# Patient Record
Sex: Female | Born: 1960 | Race: White | Hispanic: No | Marital: Married
Health system: Southern US, Community
[De-identification: ages and names within clinical notes are randomized; demographics above are authoritative.]

---

## 1998-07-24 ENCOUNTER — Other Ambulatory Visit: Admission: RE | Admit: 1998-07-24 | Discharge: 1998-07-24 | Payer: Self-pay | Admitting: Gynecology

## 1999-07-23 ENCOUNTER — Other Ambulatory Visit: Admission: RE | Admit: 1999-07-23 | Discharge: 1999-07-23 | Payer: Self-pay | Admitting: Gynecology

## 2000-07-15 ENCOUNTER — Encounter: Payer: Self-pay | Admitting: Gynecology

## 2000-07-15 ENCOUNTER — Ambulatory Visit (HOSPITAL_COMMUNITY): Admission: RE | Admit: 2000-07-15 | Discharge: 2000-07-15 | Payer: Self-pay | Admitting: Gynecology

## 2000-07-29 ENCOUNTER — Other Ambulatory Visit: Admission: RE | Admit: 2000-07-29 | Discharge: 2000-07-29 | Payer: Self-pay | Admitting: Gynecology

## 2001-07-22 ENCOUNTER — Ambulatory Visit (HOSPITAL_COMMUNITY): Admission: RE | Admit: 2001-07-22 | Discharge: 2001-07-22 | Payer: Self-pay | Admitting: Gynecology

## 2001-07-22 ENCOUNTER — Encounter: Payer: Self-pay | Admitting: Gynecology

## 2001-08-03 ENCOUNTER — Other Ambulatory Visit: Admission: RE | Admit: 2001-08-03 | Discharge: 2001-08-03 | Payer: Self-pay | Admitting: Gynecology

## 2002-07-28 ENCOUNTER — Encounter: Payer: Self-pay | Admitting: Gynecology

## 2002-07-28 ENCOUNTER — Ambulatory Visit (HOSPITAL_COMMUNITY): Admission: RE | Admit: 2002-07-28 | Discharge: 2002-07-28 | Payer: Self-pay | Admitting: Gynecology

## 2002-08-09 ENCOUNTER — Other Ambulatory Visit: Admission: RE | Admit: 2002-08-09 | Discharge: 2002-08-09 | Payer: Self-pay | Admitting: Gynecology

## 2002-09-28 ENCOUNTER — Encounter: Payer: Self-pay | Admitting: *Deleted

## 2002-09-28 ENCOUNTER — Ambulatory Visit (HOSPITAL_COMMUNITY): Admission: RE | Admit: 2002-09-28 | Discharge: 2002-09-28 | Payer: Self-pay | Admitting: *Deleted

## 2003-08-03 ENCOUNTER — Ambulatory Visit (HOSPITAL_COMMUNITY): Admission: RE | Admit: 2003-08-03 | Discharge: 2003-08-03 | Payer: Self-pay | Admitting: Gynecology

## 2003-08-15 ENCOUNTER — Other Ambulatory Visit: Admission: RE | Admit: 2003-08-15 | Discharge: 2003-08-15 | Payer: Self-pay | Admitting: Gynecology

## 2004-01-26 ENCOUNTER — Ambulatory Visit (HOSPITAL_COMMUNITY): Admission: RE | Admit: 2004-01-26 | Discharge: 2004-01-26 | Payer: Self-pay | Admitting: Oncology

## 2004-08-01 ENCOUNTER — Ambulatory Visit: Payer: Self-pay | Admitting: Oncology

## 2005-01-18 ENCOUNTER — Ambulatory Visit: Payer: Self-pay | Admitting: Oncology

## 2005-01-28 ENCOUNTER — Ambulatory Visit (HOSPITAL_COMMUNITY): Admission: RE | Admit: 2005-01-28 | Discharge: 2005-01-28 | Payer: Self-pay | Admitting: Oncology

## 2005-06-04 ENCOUNTER — Ambulatory Visit (HOSPITAL_COMMUNITY): Admission: RE | Admit: 2005-06-04 | Discharge: 2005-06-04 | Payer: Self-pay | Admitting: Gynecology

## 2005-08-09 ENCOUNTER — Ambulatory Visit: Payer: Self-pay | Admitting: Oncology

## 2006-01-07 ENCOUNTER — Ambulatory Visit: Payer: Self-pay | Admitting: Oncology

## 2006-08-04 ENCOUNTER — Ambulatory Visit: Payer: Self-pay | Admitting: Oncology

## 2006-08-05 LAB — IRON AND TIBC
%SAT: 7 % — ABNORMAL LOW (ref 20–55)
Iron: 21 ug/dL — ABNORMAL LOW (ref 42–145)
TIBC: 298 ug/dL (ref 250–470)

## 2006-08-05 LAB — CBC WITH DIFFERENTIAL (CANCER CENTER ONLY)
BASO#: 0 10*3/uL (ref 0.0–0.2)
EOS%: 2.6 % (ref 0.0–7.0)
Eosinophils Absolute: 0.2 10*3/uL (ref 0.0–0.5)
HCT: 37.3 % (ref 34.8–46.6)
LYMPH#: 2.2 10*3/uL (ref 0.9–3.3)
LYMPH%: 31.6 % (ref 14.0–48.0)
NEUT%: 58.4 % (ref 39.6–80.0)
RBC: 4.8 10*6/uL (ref 3.70–5.32)

## 2006-08-05 LAB — COMPREHENSIVE METABOLIC PANEL
AST: 32 U/L (ref 0–37)
BUN: 6 mg/dL (ref 6–23)
CO2: 27 mEq/L (ref 19–32)
Calcium: 9.7 mg/dL (ref 8.4–10.5)
Creatinine, Ser: 0.83 mg/dL (ref 0.40–1.20)
Total Bilirubin: 0.6 mg/dL (ref 0.3–1.2)

## 2006-08-05 LAB — LACTATE DEHYDROGENASE: LDH: 170 U/L (ref 94–250)

## 2006-08-05 LAB — AFP TUMOR MARKER: AFP-Tumor Marker: 3.2 ng/mL (ref 0.0–8.0)

## 2006-08-12 ENCOUNTER — Ambulatory Visit (HOSPITAL_COMMUNITY): Admission: RE | Admit: 2006-08-12 | Discharge: 2006-08-12 | Payer: Self-pay | Admitting: Oncology

## 2006-08-15 ENCOUNTER — Ambulatory Visit (HOSPITAL_COMMUNITY): Admission: RE | Admit: 2006-08-15 | Discharge: 2006-08-15 | Payer: Self-pay | Admitting: Gynecology

## 2007-01-23 ENCOUNTER — Ambulatory Visit: Payer: Self-pay | Admitting: Oncology

## 2007-01-27 LAB — CBC WITH DIFFERENTIAL/PLATELET
Basophils Absolute: 0 10*3/uL (ref 0.0–0.1)
EOS%: 1.4 % (ref 0.0–7.0)
Eosinophils Absolute: 0.1 10*3/uL (ref 0.0–0.5)
HCT: 38.1 % (ref 34.8–46.6)
LYMPH%: 29.7 % (ref 14.0–48.0)
MCH: 30.6 pg (ref 26.0–34.0)
NEUT#: 3.6 10*3/uL (ref 1.5–6.5)
Platelets: 308 10*3/uL (ref 145–400)
RDW: 15.8 % — ABNORMAL HIGH (ref 11.3–14.5)
WBC: 5.9 10*3/uL (ref 3.9–10.0)
lymph#: 1.7 10*3/uL (ref 0.9–3.3)

## 2007-01-27 LAB — IRON AND TIBC
%SAT: 24 % (ref 20–55)
Iron: 62 ug/dL (ref 42–145)

## 2007-02-02 ENCOUNTER — Ambulatory Visit: Payer: Self-pay | Admitting: Oncology

## 2007-08-15 IMAGING — CT CT ABDOMEN W/ CM
2 of 5 series · 17 of 46 positions shown, 19 images · IV contrast (omnipaque)
Comparison: 01/28/2005

CLINICAL DATA: Hemochromatosis

ABDOMEN CT WITH CONTRAST
TECHNIQUE: Multidetector CT imaging of the abdomen was performed following the
standard protocol during bolus administration of intravenous contrast.
Contrast:  100 cc Omnipaque 300

[Series 2: abd 5.0 b40s · axial · 0.74mm/px · z∈[-484,-270]mm · 14 of 51 slices shown, 16 images]
[im 4/51  soft-tissue]
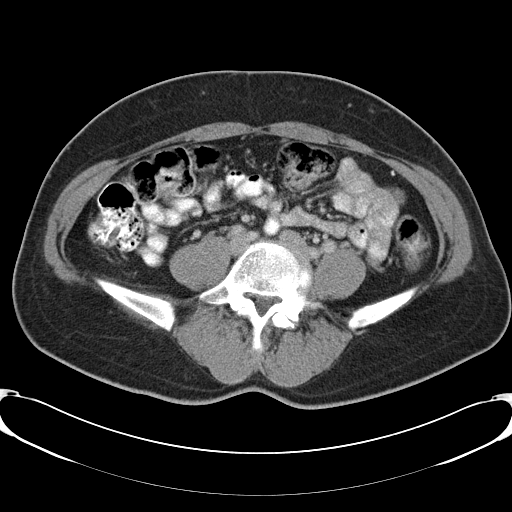
[im 4/51  bone]
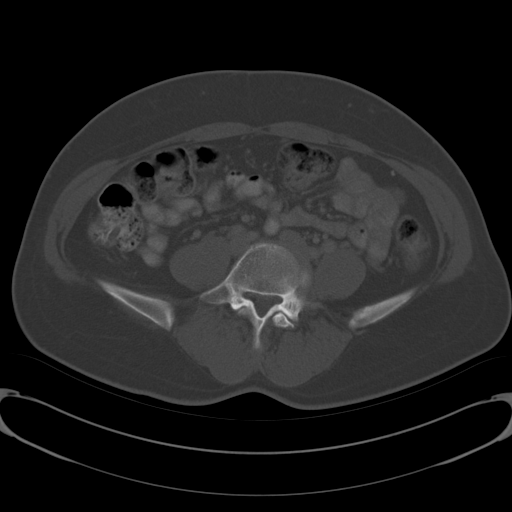
[im 7/51  soft-tissue]
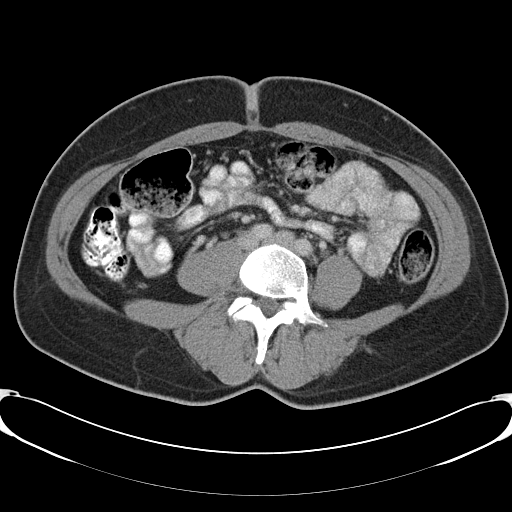
[im 10/51  soft-tissue]
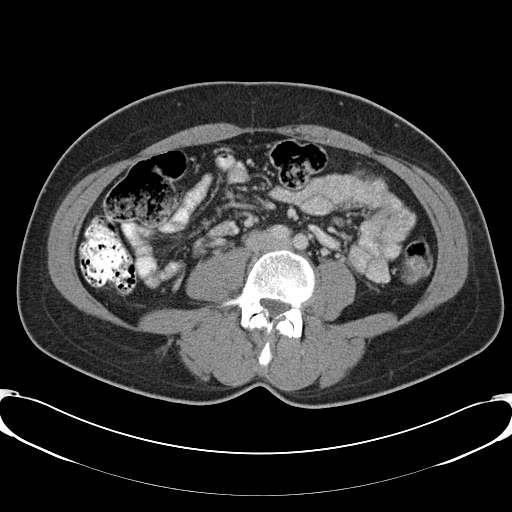
[im 13/51  soft-tissue]
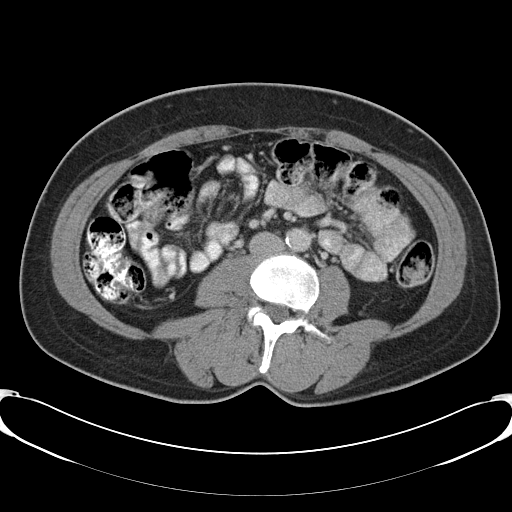
[im 16/51  soft-tissue]
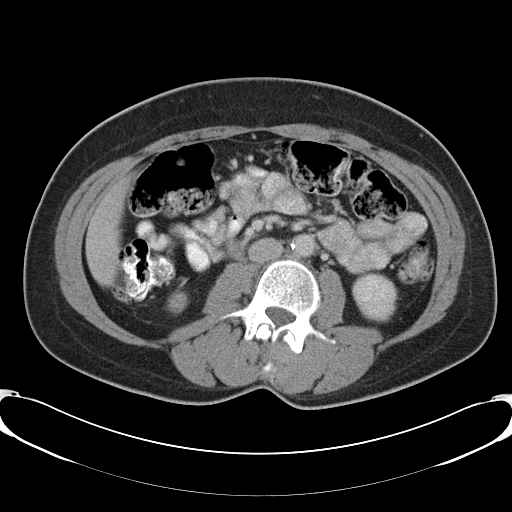
[im 19/51  soft-tissue]
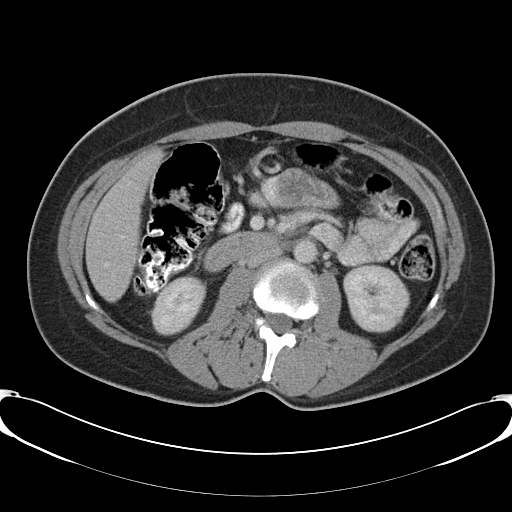
[im 22/51  soft-tissue]
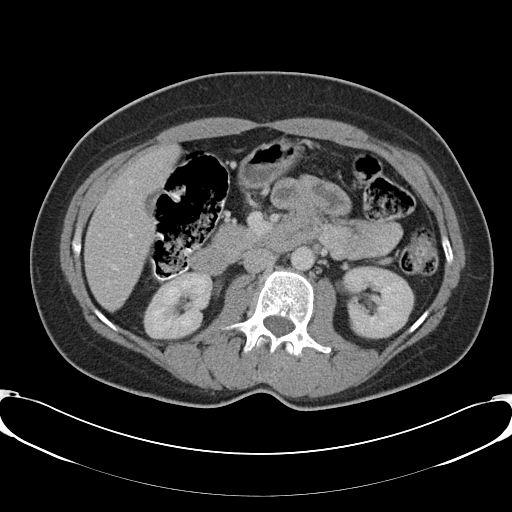
[im 29/51  soft-tissue]
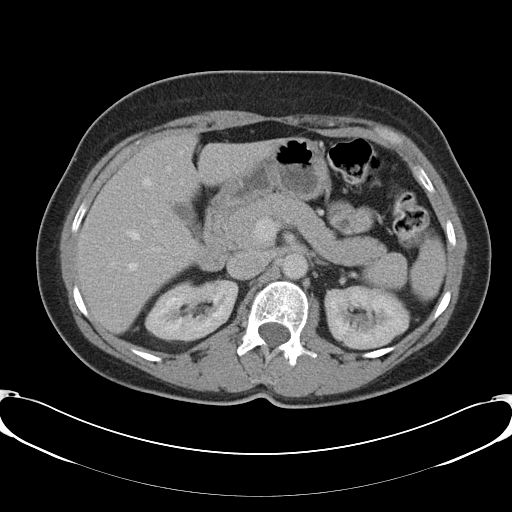
[im 32/51  soft-tissue]
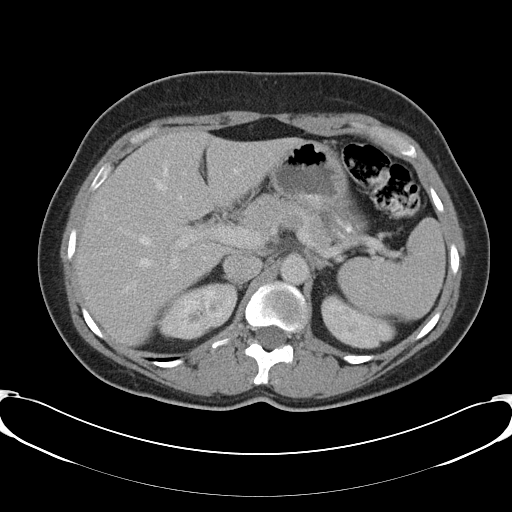
[im 32/51  bone]
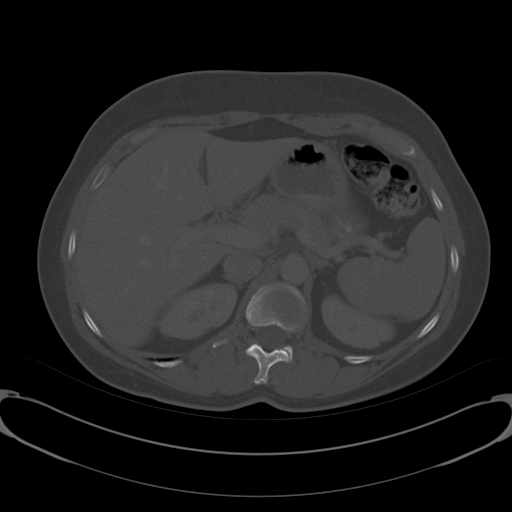
[im 35/51  soft-tissue]
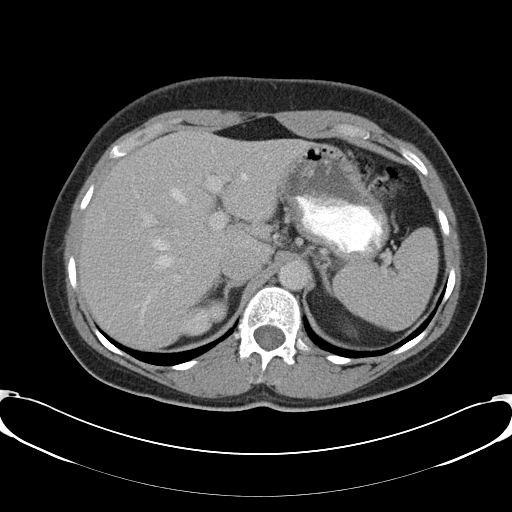
[im 38/51  soft-tissue]
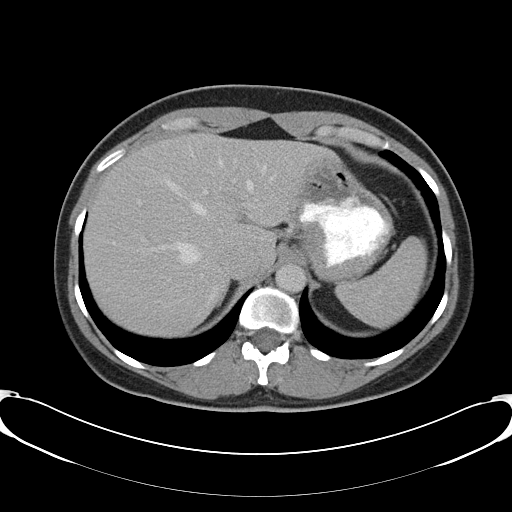
[im 41/51  soft-tissue]
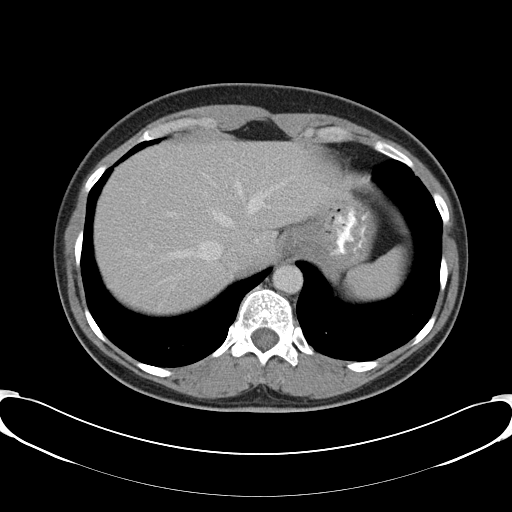
[im 44/51  soft-tissue]
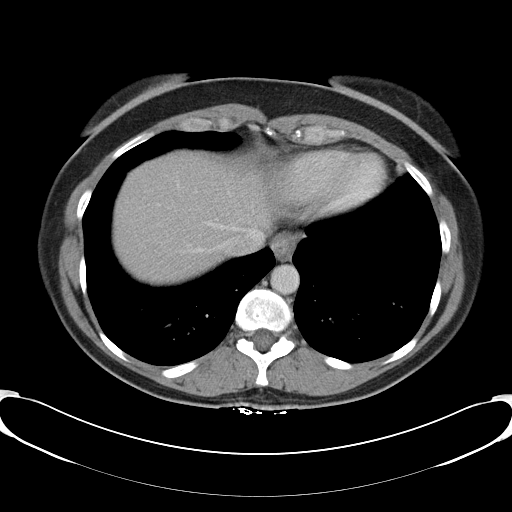
[im 47/51  soft-tissue]
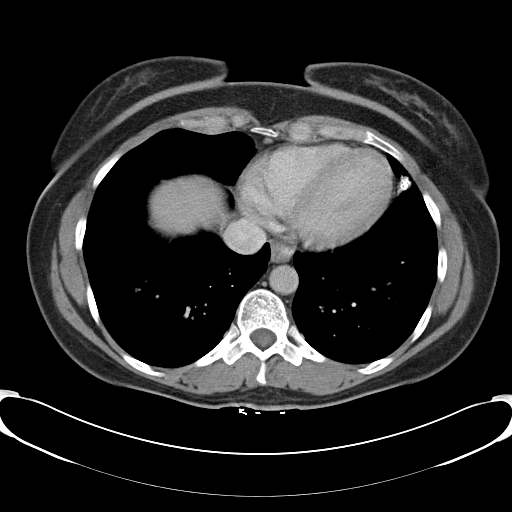

[Series 602: <mpr thick range> · coronal · 0.74mm/px · 3 of 71 slices shown]
[im 24/71  soft-tissue]
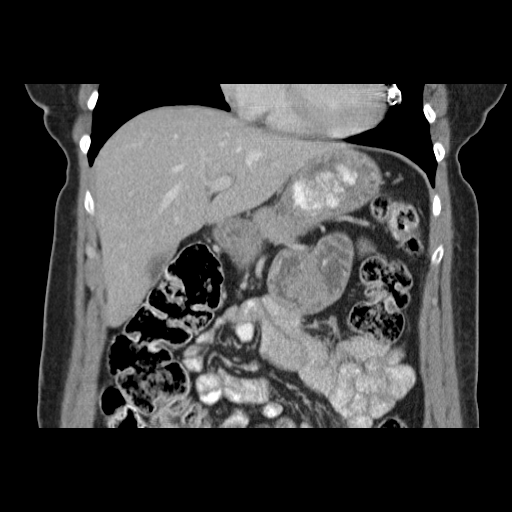
[im 32/71  soft-tissue]
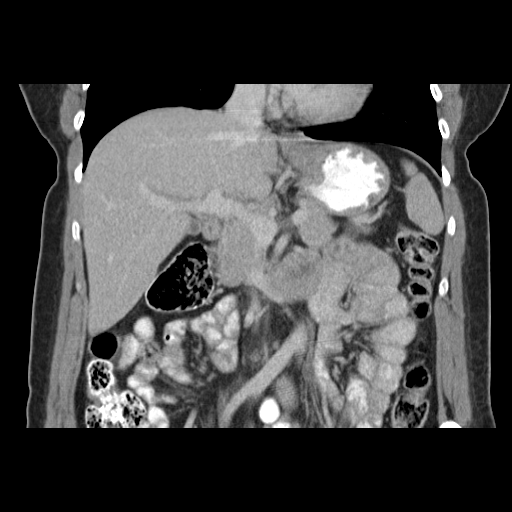
[im 39/71  soft-tissue]
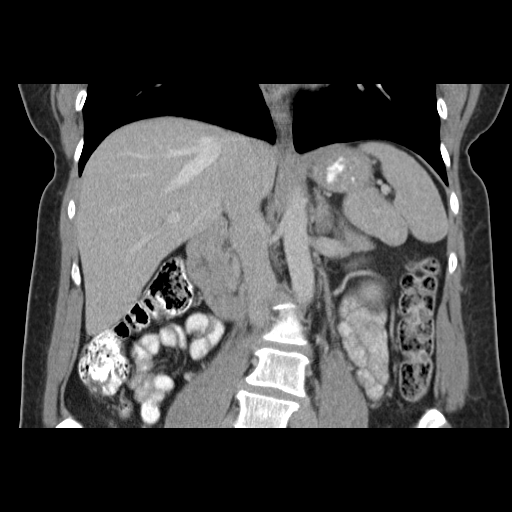

[17 of 46 positions shown; findings below may reference images not displayed]

FINDINGS: The liver, spleen, pancreas, adrenals, kidneys are unremarkable. No
liver masses. Tubular densities in the left lingula are again noted, now
containing metallic structures, likely from coiling of pulmonary AVM. No pleural
effusion. Heart is normal size.

There is mild leftward scoliosis of the lumbar spine. No focal bony abnormality.

Gallbladder is contracted. Bowel grossly unremarkable. No free fluid, free air,
or adenopathy.

IMPRESSION

No acute findings. No liver masses seen.

Presumed coiled AVM in the lingula of the left lung.

## 2008-03-01 ENCOUNTER — Ambulatory Visit (HOSPITAL_COMMUNITY): Admission: RE | Admit: 2008-03-01 | Discharge: 2008-03-01 | Payer: Self-pay | Admitting: Gynecology

## 2013-03-03 ENCOUNTER — Encounter (HOSPITAL_COMMUNITY): Payer: Self-pay | Admitting: Emergency Medicine

## 2013-03-03 ENCOUNTER — Emergency Department (HOSPITAL_COMMUNITY)
Admission: EM | Admit: 2013-03-03 | Discharge: 2013-03-03 | Disposition: A | Discharge status: Home / Self Care | Payer: Managed Care, Other (non HMO) | Attending: Emergency Medicine | Admitting: Emergency Medicine

## 2013-03-03 DIAGNOSIS — R61 Generalized hyperhidrosis: Secondary | ICD-10-CM | POA: Insufficient documentation

## 2013-03-03 DIAGNOSIS — Z79899 Other long term (current) drug therapy: Secondary | ICD-10-CM | POA: Insufficient documentation

## 2013-03-03 DIAGNOSIS — R42 Dizziness and giddiness: Secondary | ICD-10-CM | POA: Insufficient documentation

## 2013-03-03 DIAGNOSIS — R110 Nausea: Secondary | ICD-10-CM | POA: Insufficient documentation

## 2013-03-03 DIAGNOSIS — R55 Syncope and collapse: Secondary | ICD-10-CM

## 2013-03-03 LAB — CBC WITH DIFFERENTIAL/PLATELET
Basophils Absolute: 0 10*3/uL (ref 0.0–0.1)
Eosinophils Absolute: 0.2 10*3/uL (ref 0.0–0.7)
HCT: 31.4 % — ABNORMAL LOW (ref 36.0–46.0)
Hemoglobin: 10.5 g/dL — ABNORMAL LOW (ref 12.0–15.0)
Lymphs Abs: 1.3 10*3/uL (ref 0.7–4.0)
MCH: 26.9 pg (ref 26.0–34.0)
MCV: 80.5 fL (ref 78.0–100.0)
Neutro Abs: 6.8 10*3/uL (ref 1.7–7.7)
Platelets: 296 10*3/uL (ref 150–400)
RBC: 3.9 MIL/uL (ref 3.87–5.11)
RDW: 15.2 % (ref 11.5–15.5)
WBC: 8.8 10*3/uL (ref 4.0–10.5)

## 2013-03-03 LAB — COMPREHENSIVE METABOLIC PANEL
AST: 20 U/L (ref 0–37)
Alkaline Phosphatase: 57 U/L (ref 39–117)
BUN: 12 mg/dL (ref 6–23)
CO2: 22 mEq/L (ref 19–32)
Calcium: 8 mg/dL — ABNORMAL LOW (ref 8.4–10.5)
Glucose, Bld: 109 mg/dL — ABNORMAL HIGH (ref 70–99)
Sodium: 133 mEq/L — ABNORMAL LOW (ref 135–145)
Total Bilirubin: 0.3 mg/dL (ref 0.3–1.2)
Total Protein: 6.4 g/dL (ref 6.0–8.3)

## 2013-03-03 LAB — POCT I-STAT TROPONIN I: Troponin i, poc: 0.02 ng/mL (ref 0.00–0.08)

## 2013-03-03 MED ORDER — ONDANSETRON HCL 4 MG PO TABS: 4.0000 mg | ORAL_TABLET | Freq: Four times a day (QID) | ORAL | Status: AC

## 2013-03-03 MED ORDER — SODIUM CHLORIDE 0.9 % IV BOLUS (SEPSIS)
1000.0000 mL | Freq: Once | INTRAVENOUS | Status: AC
  Administered 2013-03-03: 1000 mL via INTRAVENOUS

## 2013-03-03 MED ORDER — SODIUM CHLORIDE 0.9 % IV BOLUS (SEPSIS)
500.0000 mL | Freq: Once | INTRAVENOUS | Status: AC
  Administered 2013-03-03: 500 mL via INTRAVENOUS

## 2013-03-03 MED ORDER — ONDANSETRON HCL 4 MG/2ML IJ SOLN
4.0000 mg | Freq: Once | INTRAMUSCULAR | Status: AC
  Administered 2013-03-03: 4 mg via INTRAVENOUS
  Filled 2013-03-03: qty 2

## 2013-03-03 NOTE — ED Provider Notes (Signed)
CSN: [id number]     Arrival date & time 03/03/13  1443 History   First MD Initiated Contact with Patient 03/03/13 1510     Chief Complaint  Patient presents with  . Near Syncope   (Consider location/radiation/quality/duration/timing/severity/associated sxs/prior Treatment) Patient is a 52 y.o. female presenting with syncope. The history is provided by the patient. No language interpreter was used.  Loss of Consciousness Episode history:  Single Most recent episode:  Today Duration:  15 seconds Chronicity:  New Context: blood draw and standing up   Context comment:  Blood donation today at [employer] blood mobile Witnessed: yes   Ineffective treatments:  Lying down Associated symptoms: diaphoresis, dizziness and nausea   Associated symptoms: no anxiety, no chest pain, no confusion, no difficulty breathing, no fever, no focal sensory loss, no focal weakness, no headaches, no malaise/fatigue, no palpitations, no shortness of breath, no visual change, no vomiting and no weakness   Nausea:    Severity:  Mild   Onset quality:  Gradual   Duration:  1 day Risk factors: no coronary artery disease and no seizures   Patient is a well-appearing, 52 year old female who presents here by EMS after a witnessed syncopal episode at [employer]. She reports that she was in line to check out and started to feel dizzy, she told the cashier and one of the assistant managers eased her to the floor. She was only out for a few seconds per report. She denies any incontinence of bowel or bladder. She had not had any recent illness, fever, chills or sick exposure. She reports that she was feeling fine today before donating blood at a [facility] blood mobile however, she did not eat any breakfast this morning. She denies any cardiac history or history of PE or DVT. No recent surgeries.     No past medical history on file. No past surgical history on file. No family history on file. History  Substance Use Topics   . Smoking status: Not on file  . Smokeless tobacco: Not on file  . Alcohol Use: Not on file   OB History   No data available     Review of Systems  Constitutional: Positive for diaphoresis. Negative for fever and malaise/fatigue.  Respiratory: Negative for shortness of breath.   Cardiovascular: Positive for syncope. Negative for chest pain and palpitations.  Gastrointestinal: Positive for nausea. Negative for vomiting.  Neurological: Positive for dizziness, syncope and light-headedness. Negative for focal weakness, facial asymmetry, weakness, numbness and headaches.  Psychiatric/Behavioral: Negative for confusion.  All other systems reviewed and are negative.    Allergies  Review of patient's allergies indicates no known allergies.  Home Medications   Current Outpatient Rx  Name  Route  Sig  Dispense  Refill  . albuterol (PROVENTIL HFA;VENTOLIN HFA) 108 (90 BASE) MCG/ACT inhaler   Inhalation   Inhale 2 puffs into the lungs every 6 (six) hours as needed for wheezing.         Marland Kitchen ALPRAZolam (XANAX) 0.5 MG tablet   Oral   Take 0.5 mg by mouth at bedtime as needed for sleep.         Marland Kitchen FIBER PO   Oral   Take 1 tablet by mouth daily.         . fish oil-omega-3 fatty acids 1000 MG capsule   Oral   Take 1 g by mouth daily.         . Flaxseed, Linseed, (FLAX SEEDS PO)  Oral   Take 1 capsule by mouth daily.         Marland Kitchen levothyroxine (SYNTHROID, LEVOTHROID) 50 MCG tablet   Oral   Take 50 mcg by mouth daily before breakfast.         . lisinopril-hydrochlorothiazide (PRINZIDE,ZESTORETIC) 10-12.5 MG per tablet   Oral   Take 1 tablet by mouth daily.         . Saw Palmetto, Serenoa repens, (SAW PALMETTO PO)   Oral   Take 1 tablet by mouth daily.          BP 123/76  Pulse 88  Temp(Src) 97.6 F (36.4 C)  Resp 16  SpO2 100% Physical Exam  Nursing note and vitals reviewed. Constitutional: She is oriented to person, place, and time. She appears  well-developed and well-nourished. No distress.  HENT:  Head: Normocephalic and atraumatic.  Right Ear: External ear normal.  Left Ear: External ear normal.  Mouth/Throat: Oropharynx is clear and moist.  Eyes: Conjunctivae are normal. Pupils are equal, round, and reactive to light.  Neck: Normal range of motion. Neck supple. No JVD present. No tracheal deviation present. No thyromegaly present.  Cardiovascular: Normal rate, regular rhythm, normal heart sounds and intact distal pulses.   Pulmonary/Chest: Effort normal and breath sounds normal. No respiratory distress. She has no wheezes. She exhibits no tenderness.  Abdominal: Soft. Bowel sounds are normal. There is no tenderness.  Musculoskeletal: Normal range of motion.  Lymphadenopathy:    She has no cervical adenopathy.  Neurological: She is alert and oriented to person, place, and time. No cranial nerve deficit. Coordination normal.  Skin: Skin is warm and dry.  Psychiatric: She has a normal mood and affect. Her behavior is normal. Judgment and thought content normal.    ED Course  Procedures (including critical care time) Labs Review Labs Reviewed - No data to display Imaging Review No results found.  EKG Interpretation   None      Date: 03/03/2013  Rate: 76   Rhythm: normal sinus rhythm  QRS Axis: normal  Intervals: normal  ST/T Wave abnormalities: normal  Conduction Disutrbances:none  Narrative Interpretation: sinus rhythm  Old EKG Reviewed: none available Reviewed by Dr. Criss Alvine.      MDM   1. Syncope    Patient donated blood today at [facility] and did not eat for 24 hours prior. After donation, went shopping and had a syncopal episode. Troponin negative. Hgb 10.5/Hct 31.4, consistent with recent blood donation. K+3.2, Ca+8.0, slightly low. EKG normal. Exam reassuring, no focal deficits or weakness. Neuro exam within normal limits. Blood glucose within normal limits. Pt ate partial [place] sandwich and graham  crackers here. Normal Saline bolus . Pt ambulated at bedside without dizziness, light-headedness or weakness. Low risk for cardiac etiology. Discussed plan of care with pt to go home and rest today and stay well hydrated. Return if symptoms re-occur. Pt feels like going home and agrees with plan. Discharged in care of husband.        Irish Elders, NP 03/03/13 1657  Irish Elders, NP 03/03/13 1701

## 2013-03-03 NOTE — ED Provider Notes (Signed)
Medical screening examination/treatment/procedure(s) were performed by non-physician practitioner and as supervising physician I was immediately available for consultation/collaboration.   Audree Camel, MD 03/03/13 2157

## 2013-03-03 NOTE — ED Notes (Signed)
EKG given to Dr. Criss Alvine.  EDP notified of delay due to pt being in hallway due to room not being available.

## 2013-03-03 NOTE — ED Notes (Addendum)
PT donated blood at 1200PM . Pt was shopping at [employer] and felt dizzy and near syncope. Pt was assisted to the floor and did not hit head. Pt denies any Hx of syncope. Pt received 1000 ml NS iv from EMS.

## 2013-11-24 ENCOUNTER — Other Ambulatory Visit (HOSPITAL_COMMUNITY): Payer: Self-pay | Admitting: Specialist

## 2013-11-24 DIAGNOSIS — Z1231 Encounter for screening mammogram for malignant neoplasm of breast: Secondary | ICD-10-CM

## 2013-12-01 ENCOUNTER — Ambulatory Visit (HOSPITAL_COMMUNITY)
Admission: RE | Admit: 2013-12-01 | Discharge: 2013-12-01 | Disposition: A | Discharge status: Home / Self Care | Payer: Managed Care, Other (non HMO) | Source: Ambulatory Visit | Attending: Specialist | Admitting: Specialist

## 2013-12-01 DIAGNOSIS — Z1231 Encounter for screening mammogram for malignant neoplasm of breast: Secondary | ICD-10-CM | POA: Insufficient documentation

## 2013-12-02 ENCOUNTER — Other Ambulatory Visit: Payer: Self-pay | Admitting: Specialist

## 2013-12-02 DIAGNOSIS — R928 Other abnormal and inconclusive findings on diagnostic imaging of breast: Secondary | ICD-10-CM

## 2013-12-13 ENCOUNTER — Ambulatory Visit
Admission: RE | Admit: 2013-12-13 | Discharge: 2013-12-13 | Disposition: A | Discharge status: Home / Self Care | Payer: 59 | Source: Ambulatory Visit | Attending: Specialist | Admitting: Specialist

## 2013-12-13 DIAGNOSIS — R928 Other abnormal and inconclusive findings on diagnostic imaging of breast: Secondary | ICD-10-CM

## 2023-06-27 ENCOUNTER — Other Ambulatory Visit: Payer: Self-pay | Admitting: Specialist

## 2023-06-27 DIAGNOSIS — H6592 Unspecified nonsuppurative otitis media, left ear: Secondary | ICD-10-CM

## 2023-06-27 DIAGNOSIS — R42 Dizziness and giddiness: Secondary | ICD-10-CM

## 2023-07-14 ENCOUNTER — Ambulatory Visit
Admission: RE | Admit: 2023-07-14 | Discharge: 2023-07-14 | Disposition: A | Discharge status: Home / Self Care | Payer: No Typology Code available for payment source | Source: Ambulatory Visit | Attending: Specialist | Admitting: Specialist

## 2023-07-14 DIAGNOSIS — R42 Dizziness and giddiness: Secondary | ICD-10-CM

## 2023-07-14 DIAGNOSIS — H6592 Unspecified nonsuppurative otitis media, left ear: Secondary | ICD-10-CM

## 2023-07-14 MED ORDER — GADOPICLENOL 0.5 MMOL/ML IV SOLN
8.0000 mL | Freq: Once | INTRAVENOUS | Status: AC | PRN
  Administered 2023-07-14: 8 mL via INTRAVENOUS

## 2023-07-29 ENCOUNTER — Other Ambulatory Visit: Payer: Self-pay | Admitting: Specialist

## 2023-07-29 DIAGNOSIS — E782 Mixed hyperlipidemia: Secondary | ICD-10-CM

## 2023-08-05 ENCOUNTER — Ambulatory Visit
Admission: RE | Admit: 2023-08-05 | Discharge: 2023-08-05 | Disposition: A | Discharge status: Home / Self Care | Source: Ambulatory Visit | Attending: Specialist | Admitting: Specialist

## 2023-08-05 DIAGNOSIS — E782 Mixed hyperlipidemia: Secondary | ICD-10-CM
